# Patient Record
Sex: Male | Born: 1980 | Race: White | Hispanic: No | Marital: Single | Smoking: Current every day smoker
Health system: Southern US, Community
[De-identification: ages and names within clinical notes are randomized; demographics above are authoritative.]

## PROBLEM LIST (undated history)

## (undated) DIAGNOSIS — J45909 Unspecified asthma, uncomplicated: Secondary | ICD-10-CM

## (undated) DIAGNOSIS — N200 Calculus of kidney: Secondary | ICD-10-CM

## (undated) HISTORY — DX: Unspecified asthma, uncomplicated: J45.909

## (undated) HISTORY — DX: Calculus of kidney: N20.0

---

## 2017-06-09 ENCOUNTER — Emergency Department: Payer: Self-pay

## 2017-06-09 ENCOUNTER — Emergency Department
Admission: EM | Admit: 2017-06-09 | Discharge: 2017-06-09 | Disposition: A | Discharge status: Home / Self Care | Payer: Self-pay | Attending: Emergency Medicine | Admitting: Emergency Medicine

## 2017-06-09 DIAGNOSIS — Y999 Unspecified external cause status: Secondary | ICD-10-CM | POA: Insufficient documentation

## 2017-06-09 DIAGNOSIS — F17200 Nicotine dependence, unspecified, uncomplicated: Secondary | ICD-10-CM | POA: Insufficient documentation

## 2017-06-09 DIAGNOSIS — S60512A Abrasion of left hand, initial encounter: Secondary | ICD-10-CM | POA: Insufficient documentation

## 2017-06-09 DIAGNOSIS — S60222A Contusion of left hand, initial encounter: Secondary | ICD-10-CM | POA: Insufficient documentation

## 2017-06-09 DIAGNOSIS — Y9389 Activity, other specified: Secondary | ICD-10-CM | POA: Insufficient documentation

## 2017-06-09 DIAGNOSIS — Y929 Unspecified place or not applicable: Secondary | ICD-10-CM | POA: Insufficient documentation

## 2017-06-09 DIAGNOSIS — W231XXA Caught, crushed, jammed, or pinched between stationary objects, initial encounter: Secondary | ICD-10-CM | POA: Insufficient documentation

## 2017-06-09 MED ORDER — NAPROXEN 500 MG PO TABS: 500.0000 mg | ORAL_TABLET | Freq: Two times a day (BID) | ORAL | 0 refills | Status: AC

## 2017-06-09 MED ORDER — NAPROXEN 500 MG PO TABS
500.0000 mg | ORAL_TABLET | Freq: Once | ORAL | Status: AC
  Administered 2017-06-09: 500 mg via ORAL
  Filled 2017-06-09: qty 1

## 2017-06-09 NOTE — Discharge Instructions (Addendum)
Your x-ray is negative for fracture or dislocation. You have contused the soft tissues of the hand. Keep the wounds clean with soap & water.  Wear the splint for comfort. Apply ice to reduce pain and swelling. Take the prescription medicine as needed. Follow-up with [facility] as needed.

## 2017-06-09 NOTE — ED Provider Notes (Signed)
[facility] Emergency Department Provider Note ____________________________________________  Time seen: 1020  I have reviewed the triage vital signs and the nursing notes.  HISTORY  Chief Complaint  Hand Injury  HPI Ricardo Cummings is a 36 y.o. male for evaluation of left hand pain after he was helping a friend with a lawnmower yesterday.  Describes help and fell at the lumbar onto the back of a truck, when his friend lost his footing.  The lawnmower fell, crushing the patient's hand against the concrete.  He presents today with pain to the radial aspect of the wrist and superficial abrasions in the same area.  History reviewed. No pertinent past medical history.  There are no active problems to display for this patient.  History reviewed. No pertinent surgical history.  Prior to Admission medications   Medication Sig Start Date End Date Taking? Authorizing Provider  naproxen (NAPROSYN) 500 MG tablet Take 1 tablet (500 mg total) by mouth 2 (two) times daily with a meal. 06/09/17 07/09/17  Emalynn Clewis, Charlesetta Ivory, PA-C    Allergies Patient has no known allergies.  No family history on file.  Social History Social History  Substance Use Topics  . Smoking status: Current Every Day Smoker  . Smokeless tobacco: Not on file  . Alcohol use No    Review of Systems  Constitutional: Negative for fever. Cardiovascular: Negative for chest pain. Respiratory: Negative for shortness of breath. Musculoskeletal: Negative for back pain. Left hand pain as above.  Skin: Negative for rash. Neurological: Negative for headaches, focal weakness or numbness. ____________________________________________  PHYSICAL EXAM:  VITAL SIGNS: ED Triage Vitals  Enc Vitals Group     BP 06/09/17 0934 (!) 146/61     Pulse Rate 06/09/17 0934 67     Resp 06/09/17 0934 18     Temp 06/09/17 0934 97.8 F (36.6 C)     Temp Source 06/09/17 0934 Oral     SpO2 06/09/17 0934 100 %   Weight 06/09/17 0934 160 lb (72.6 kg)     Height 06/09/17 0934 5\' 11"  (1.803 m)     Head Circumference --      Peak Flow --      Pain Score 06/09/17 0933 7     Pain Loc --      Pain Edu? --      Excl. in [place]? --     Constitutional: Alert and oriented. Well appearing and in no distress. Head: Normocephalic and atraumatic. Cardiovascular: Normal distal pulses and cap refill Respiratory: Normal respiratory effort.  Musculoskeletal: Left hand without any obvious deformity, dislocation, ecchymosis, or effusion.  Patient with normal composite fist on the left.  Mildly tender to palpation to the base of the left thumb.  Normal pronation and supination range.  Normal resisted flexion and extension on exam.  Nontender with normal range of motion in all extremities.  Neurologic: Cranial nerves II through XII grossly intact.  Normal intrinsic and opposition testing.  Normal gross sensation. Normal speech and language. No gross focal neurologic deficits are appreciated. Skin:  Skin is warm, dry and intact. No rash noted.  Facial abrasions over the hand without any laceration, ulceration, or ecchymosis. ____________________________________________   RADIOLOGY  Left Hand  IMPRESSION: No acute osseous injury of the left hand.  I, Doyce Stonehouse, Charlesetta Ivory, personally viewed and evaluated these images (plain radiographs) as part of my medical decision making, as well as reviewing the written report by the radiologist. ____________________________________________  PROCEDURES  Ace bandage  ____________________________________________  INITIAL IMPRESSION / ASSESSMENT AND PLAN / ED COURSE  Patient with ED evaluation of a crush/contusion to the left hand.  No x-ray indication of any acute fracture or dislocation.  Patient is with some mild radiodorsal hand pain.  No acute arthropathy or tendinopathy suspected.  He is discharged with an Ace bandage in place for comfort.  He will follow-up with 1 of the  local community clinics for ongoing symptom management.  Work note is provided for today as requested. ____________________________________________  FINAL CLINICAL IMPRESSION(S) / ED DIAGNOSES  Final diagnoses:  Contusion of left hand, initial encounter  Abrasion of left hand, initial encounter      Lissa HoardMenshew, Makinsley Schiavi V Bacon, PA-C 06/09/17 1917    Myrna BlazerSchaevitz, David Matthew, MD 06/10/17 1438

## 2017-06-09 NOTE — ED Triage Notes (Signed)
Left hand and wrist pain after lawnmower "fell" on it yesterday. Pt alert and oriented X4, active, cooperative, pt in NAD. RR even and unlabored, color WNL.

## 2019-10-21 ENCOUNTER — Emergency Department: Payer: Self-pay

## 2019-10-21 ENCOUNTER — Other Ambulatory Visit: Payer: Self-pay

## 2019-10-21 ENCOUNTER — Emergency Department
Admission: EM | Admit: 2019-10-21 | Discharge: 2019-10-21 | Disposition: A | Discharge status: Home / Self Care | Payer: Self-pay | Attending: Emergency Medicine | Admitting: Emergency Medicine

## 2019-10-21 DIAGNOSIS — Y939 Activity, unspecified: Secondary | ICD-10-CM | POA: Insufficient documentation

## 2019-10-21 DIAGNOSIS — M7022 Olecranon bursitis, left elbow: Secondary | ICD-10-CM | POA: Insufficient documentation

## 2019-10-21 DIAGNOSIS — F17290 Nicotine dependence, other tobacco product, uncomplicated: Secondary | ICD-10-CM | POA: Insufficient documentation

## 2019-10-21 MED ORDER — LIDOCAINE HCL (PF) 1 % IJ SOLN
5.0000 mL | Freq: Once | INTRAMUSCULAR | Status: AC
  Administered 2019-10-21: 5 mL
  Filled 2019-10-21: qty 5

## 2019-10-21 MED ORDER — HYDROCODONE-ACETAMINOPHEN 5-325 MG PO TABS: 1.0000 | ORAL_TABLET | Freq: Four times a day (QID) | ORAL | 0 refills | Status: AC | PRN

## 2019-10-21 MED ORDER — PREDNISONE 10 MG PO TABS: ORAL_TABLET | ORAL | 0 refills | Status: AC

## 2019-10-21 NOTE — ED Triage Notes (Signed)
Pt in from home with steady gait. Pt reports sudden swelling to L elbow/arm since waking up this morning. Reports it feels hot and is extremely painful and taut. Denies specific mechanism of injury. Reports history of "bursitis" in his knees. Elbow obviously swollen, skin appropriate color, warm but not hot to touch, painful to pt upon palpation. L radial pulse 2+.

## 2019-10-21 NOTE — ED Notes (Signed)
Lidocaine to provider Levada Schilling.

## 2019-10-21 NOTE — Discharge Instructions (Signed)
Follow-up with your primary care provider or Dr. Deeann Saint who is on-call for orthopedics if you continue to have problems with your elbow.  Begin taking the prednisone as directed and also take Vicodin as needed for pain.  Do not drive or operate machinery while taking the Vicodin as it could cause drowsiness and increase your risk for injury.  You may use ice to your elbow as needed for discomfort.

## 2019-10-21 NOTE — ED Provider Notes (Signed)
[facility] Emergency Department Provider Note  ____________________________________________   First MD Initiated Contact with Patient 10/21/19 1156     (approximate)  I have reviewed the triage vital signs and the nursing notes.   HISTORY  Chief Complaint No chief complaint on file.   HPI Ricardo Cummings is a 39 y.o. male presents to the ED with complaint of swelling to his left elbow since waking up this morning.  Patient states it felt hot and extremely painful to touch.  Patient denies any injury.  He states he has a history of bursitis in his knees.  He denies any fever, chills, nausea or vomiting.  He rates his pain as a 10/10      History reviewed. No pertinent past medical history.  There are no problems to display for this patient.   History reviewed. No pertinent surgical history.  Prior to Admission medications   Medication Sig Start Date End Date Taking? Authorizing Provider  HYDROcodone-acetaminophen (NORCO/VICODIN) 5-325 MG tablet Take 1 tablet by mouth every 6 (six) hours as needed for moderate pain. 10/21/19   Johnn Hai, PA-C  predniSONE (DELTASONE) 10 MG tablet Take 6 tablets  today, on day 2 take 5 tablets, day 3 take 4 tablets, day 4 take 3 tablets, day 5 take  2 tablets and 1 tablet the last day 10/21/19   Johnn Hai, PA-C    Allergies Patient has no known allergies.  History reviewed. No pertinent family history.  Social History Social History   Tobacco Use  . Smoking status: Current Every Day Smoker  . Smokeless tobacco: Current User  Substance Use Topics  . Alcohol use: No  . Drug use: Not on file    Review of Systems  Constitutional: No fever/chills Cardiovascular: Denies chest pain. Respiratory: Denies shortness of breath. Gastrointestinal: No nausea, no vomiting. Musculoskeletal: Left elbow pain. Skin: Negative for rash. Neurological: Negative for headaches, focal weakness or numbness.  ___________________________________________   PHYSICAL EXAM:  VITAL SIGNS: ED Triage Vitals [10/21/19 1202]  Enc Vitals Group     BP (!) 152/98     Pulse Rate 93     Resp 18     Temp 98.3 F (36.8 C)     Temp Source Oral     SpO2 98 %     Weight      Height      Head Circumference      Peak Flow      Pain Score      Pain Loc      Pain Edu?      Excl. in [place]?     Constitutional: Alert and oriented. Well appearing and in no acute distress. Eyes: Conjunctivae are normal.  Head: Atraumatic. Neck: No stridor.   Cardiovascular: Normal rate, regular rhythm. Grossly normal heart sounds.  Good peripheral circulation. Respiratory: Normal respiratory effort.  No retractions. Lungs CTAB. Musculoskeletal: Examination of the left elbow there is moderate localized edema posterior olecranon area with moderate tenderness to palpation.  No warmth or redness is noted.  No obvious injury and skin is intact.  Patient's range of motion is decreased secondary to pain and swelling.  Exam is consistent with an olecranon bursitis. Neurologic:  Normal speech and language. No gross focal neurologic deficits are appreciated. No gait instability. Skin:  Skin is warm, dry and intact. No rash noted. Psychiatric: Mood and affect are normal. Speech and behavior are normal.  ____________________________________________   LABS (all labs ordered are  listed, but only abnormal results are displayed)  Labs Reviewed - No data to display  RADIOLOGY  ED MD interpretation:   Left elbow without acute bony injury.  Official radiology report(s): DG Elbow Complete Left  Result Date: 10/21/2019 CLINICAL DATA:  Left elbow pain and swelling since this morning. No known injury. EXAM: LEFT ELBOW - COMPLETE 3+ VIEW COMPARISON:  None. FINDINGS: Pronounced posterior soft tissue swelling. Minimal olecranon spur. Otherwise, normal appearing bones and soft tissues with no effusion seen. IMPRESSION: Posterior soft tissue  swelling, compatible with olecranon bursitis. Electronically Signed   By: Beckie Salts M.D.   On: 10/21/2019 12:48    ____________________________________________   PROCEDURES  Procedure(s) performed (including Critical Care):  Aspiration of blood/fluid  Date/Time: 10/21/2019 1:10 PM Performed by: Tommi Rumps, PA-C Authorized by: Tommi Rumps, PA-C  Consent: Verbal consent obtained. Written consent not obtained. Consent given by: patient Patient understanding: patient states understanding of the procedure being performed Imaging studies: imaging studies available Preparation: Patient was prepped and draped in the usual sterile fashion. Local anesthesia used: yes Anesthesia: local infiltration  Anesthesia: Local anesthesia used: yes Local Anesthetic: lidocaine 1% without epinephrine Anesthetic total: 3 mL  Sedation: Patient sedated: no  Patient tolerance: patient tolerated the procedure well with no immediate complications Comments: 3 cc of serosanguineous fluid was removed from the olecranon bursa.  Patient tolerated procedure well.   ____________________________________________   INITIAL IMPRESSION / ASSESSMENT AND PLAN / ED COURSE  As part of my medical decision making, I reviewed the following data within the electronic MEDICAL RECORD NUMBER Notes from prior ED visits and [place] Controlled Substance Database  39 year old male presents to the ED with complaint of waking up this morning with swelling to his left elbow without history of injury.  Patient states he has a history of bursitis in his knees.  He has not taken any over-the-counter medication.  He denies any other medical problems.  Aspiration of the bursa was successful with 3 cc of serosanguineous fluid withdrawn.  Patient had a pressure dressing placed.  He is to begin taking prednisone 60 mg 6-day taper and Norco if needed for pain.  He is to follow-up with Dr. Deeann Saint who is on-call for orthopedics if he  continues to have problems with his elbow.  ____________________________________________   FINAL CLINICAL IMPRESSION(S) / ED DIAGNOSES  Final diagnoses:  Olecranon bursitis of left elbow     ED Discharge Orders         Ordered    predniSONE (DELTASONE) 10 MG tablet     10/21/19 1329    HYDROcodone-acetaminophen (NORCO/VICODIN) 5-325 MG tablet  Every 6 hours PRN     10/21/19 1329           Note:  This document was prepared using Dragon voice recognition software and may include unintentional dictation errors.    Tommi Rumps, PA-C 10/21/19 1352    Dionne Bucy, MD 10/21/19 1620

## 2021-02-26 IMAGING — DX DG ELBOW COMPLETE 3+V*L*
4 series · 4 of 4 positions shown · non-contrast
Comparison: None.

CLINICAL DATA: Left elbow pain and swelling since this morning. No
known injury.

EXAM:
LEFT ELBOW - COMPLETE 3+ VIEW

[elbow ap]
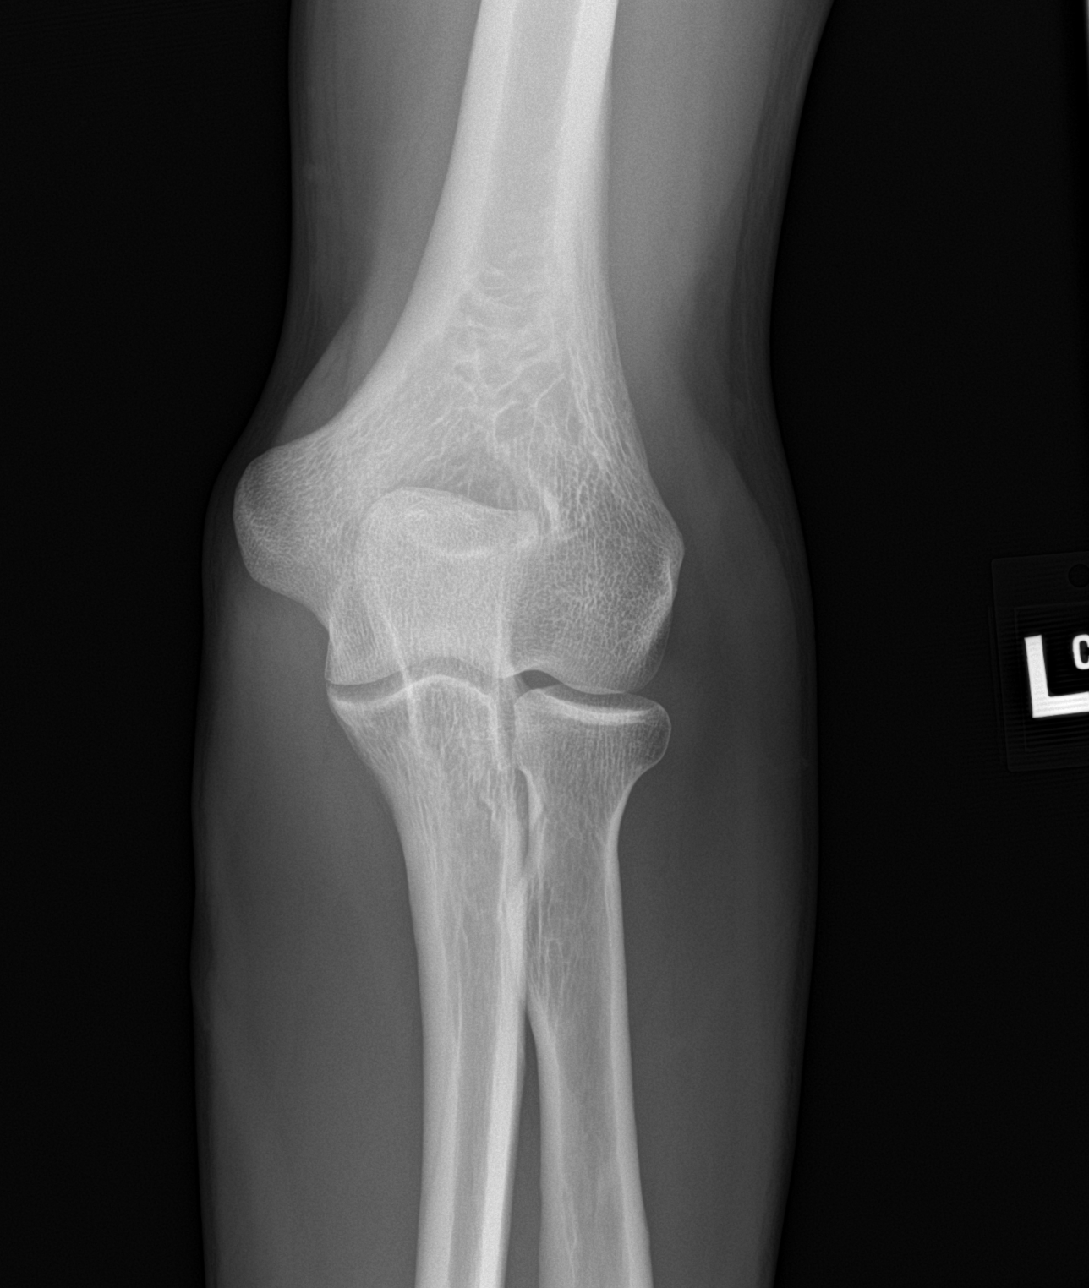

[elbow obl (1 of 2)]
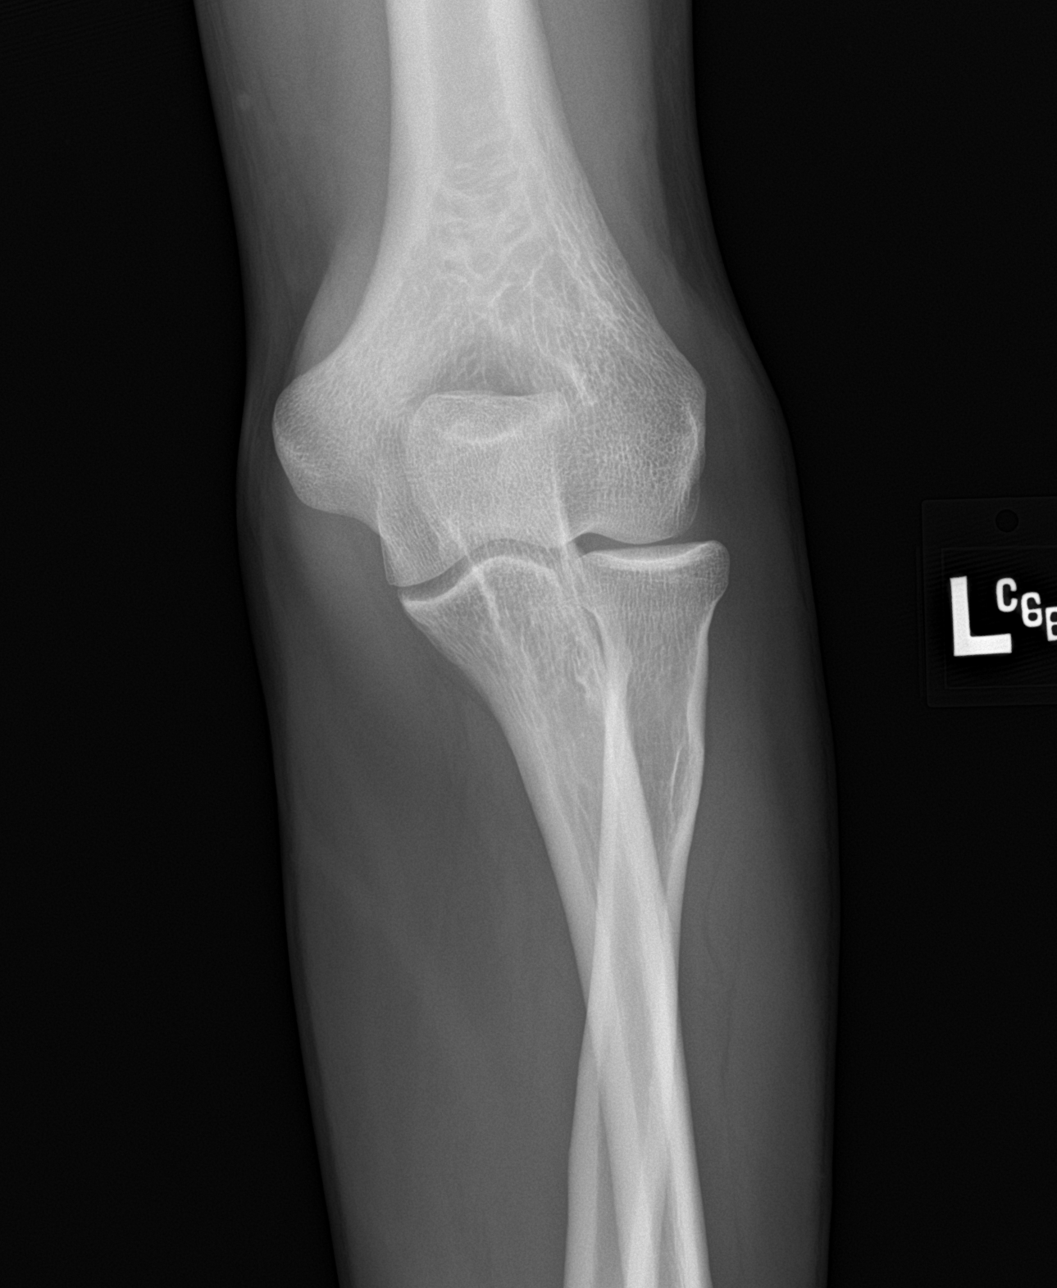

[elbow obl (2 of 2)]
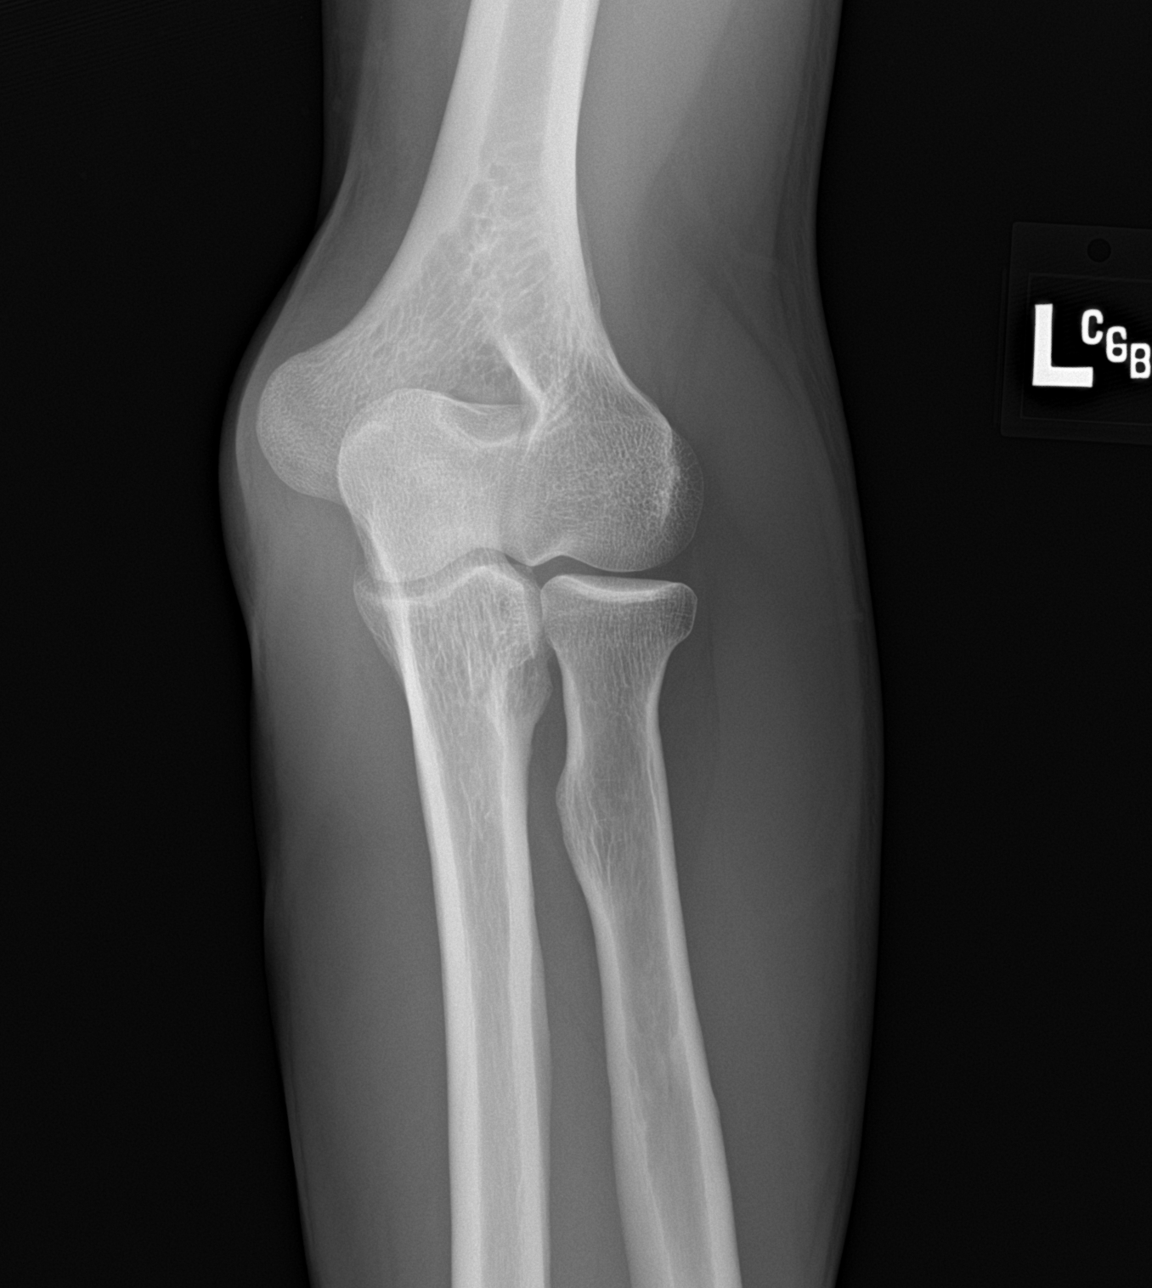

[elbow lat]
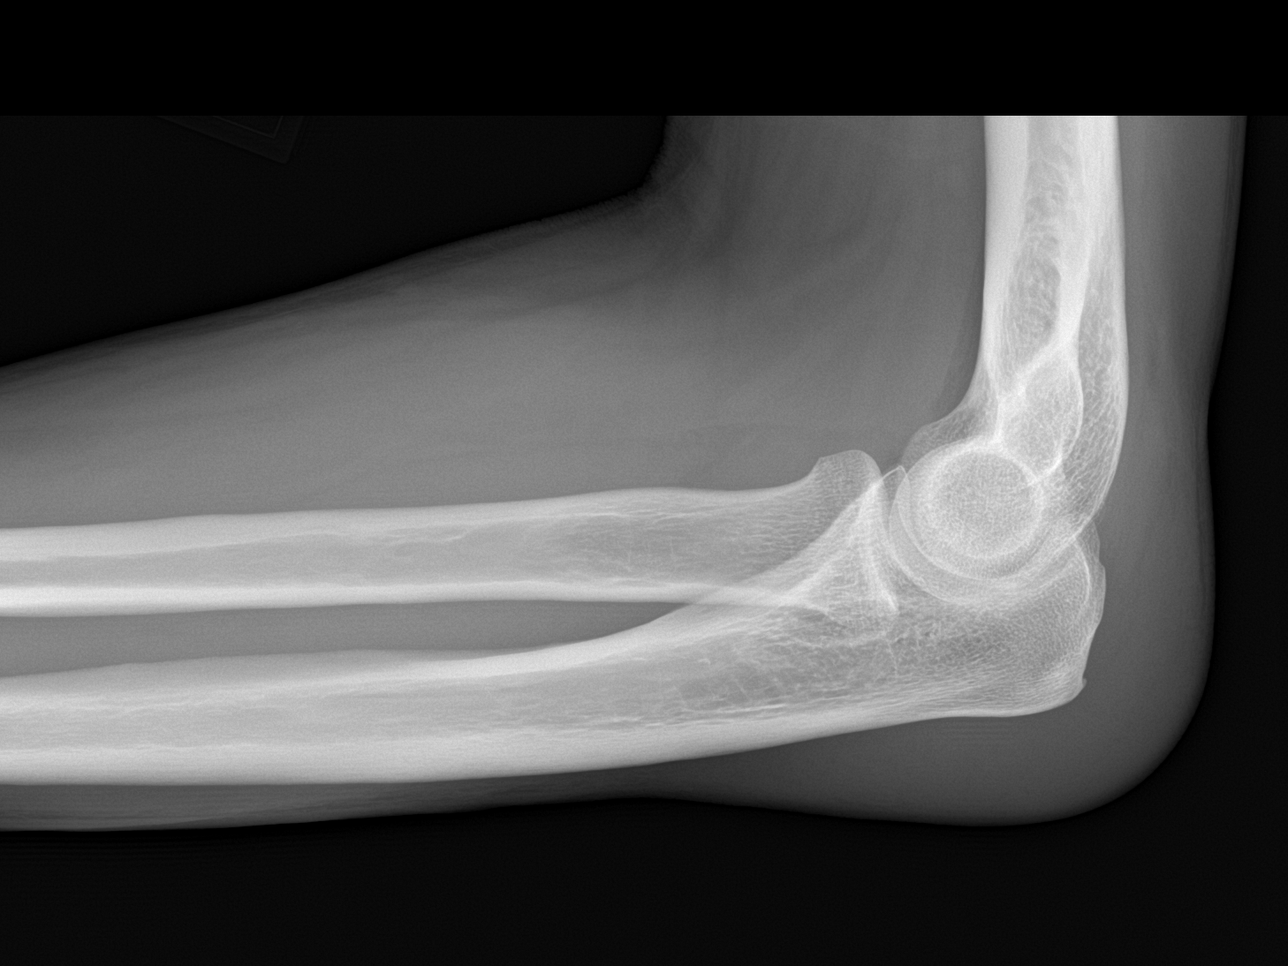

[4 of 4 positions shown; findings below may reference images not displayed]

FINDINGS: Pronounced posterior soft tissue swelling. Minimal olecranon spur.
Otherwise, normal appearing bones and soft tissues with no effusion
seen.
IMPRESSION: Posterior soft tissue swelling, compatible with olecranon bursitis.

## 2023-02-06 ENCOUNTER — Other Ambulatory Visit: Payer: Self-pay

## 2023-02-06 ENCOUNTER — Emergency Department
Admission: EM | Admit: 2023-02-06 | Discharge: 2023-02-06 | Disposition: A | Discharge status: Home / Self Care | Payer: Self-pay | Attending: Emergency Medicine | Admitting: Emergency Medicine

## 2023-02-06 ENCOUNTER — Encounter: Payer: Self-pay | Admitting: Radiology

## 2023-02-06 DIAGNOSIS — M71162 Other infective bursitis, left knee: Secondary | ICD-10-CM

## 2023-02-06 DIAGNOSIS — M7042 Prepatellar bursitis, left knee: Secondary | ICD-10-CM | POA: Insufficient documentation

## 2023-02-06 DIAGNOSIS — Y939 Activity, unspecified: Secondary | ICD-10-CM | POA: Insufficient documentation

## 2023-02-06 DIAGNOSIS — J45909 Unspecified asthma, uncomplicated: Secondary | ICD-10-CM | POA: Insufficient documentation

## 2023-02-06 LAB — COMPREHENSIVE METABOLIC PANEL
ALT: 13 U/L (ref 0–44)
AST: 23 U/L (ref 15–41)
Albumin: 3.8 g/dL (ref 3.5–5.0)
Alkaline Phosphatase: 75 U/L (ref 38–126)
Anion gap: 9 (ref 5–15)
BUN: 12 mg/dL (ref 6–20)
CO2: 27 mmol/L (ref 22–32)
Calcium: 9.2 mg/dL (ref 8.9–10.3)
Chloride: 102 mmol/L (ref 98–111)
Creatinine, Ser: 0.88 mg/dL (ref 0.61–1.24)
GFR, Estimated: >60 mL/min (ref >60)
Glucose, Bld: 132 mg/dL — ABNORMAL HIGH (ref 70–99)
Potassium: 3.4 mmol/L — ABNORMAL LOW (ref 3.5–5.1)
Sodium: 138 mmol/L (ref 135–145)
Total Bilirubin: 0.9 mg/dL (ref 0.3–1.2)
Total Protein: 7.4 g/dL (ref 6.5–8.1)

## 2023-02-06 LAB — SYNOVIAL CELL COUNT + DIFF, W/ CRYSTALS
Crystals, Fluid: NONE SEEN
Eosinophils-Synovial: 0 %
Lymphocytes-Synovial Fld: 4 %
Monocyte-Macrophage-Synovial Fluid: 1 %
Neutrophil, Synovial: 95 %
WBC, Synovial: 27281 /mm3 — ABNORMAL HIGH (ref 0–200)

## 2023-02-06 LAB — BODY FLUID CULTURE W GRAM STAIN

## 2023-02-06 LAB — CBC
HCT: 41.4 % (ref 39.0–52.0)
Hemoglobin: 14.1 g/dL (ref 13.0–17.0)
MCH: 30.8 pg (ref 26.0–34.0)
MCHC: 34.1 g/dL (ref 30.0–36.0)
MCV: 90.4 fL (ref 80.0–100.0)
Platelets: 226 10*3/uL (ref 150–400)
RBC: 4.58 MIL/uL (ref 4.22–5.81)
RDW: 12.3 % (ref 11.5–15.5)
WBC: 9.5 10*3/uL (ref 4.0–10.5)
nRBC: 0 % (ref 0.0–0.2)

## 2023-02-06 MED ORDER — VANCOMYCIN HCL 1500 MG/300ML IV SOLN
1500.0000 mg | Freq: Once | INTRAVENOUS | Status: AC
  Administered 2023-02-06: 1500 mg via INTRAVENOUS
  Filled 2023-02-06: qty 300

## 2023-02-06 MED ORDER — SODIUM CHLORIDE 0.9 % IV SOLN
2.0000 g | Freq: Once | INTRAVENOUS | Status: AC
  Administered 2023-02-06: 2 g via INTRAVENOUS
  Filled 2023-02-06: qty 20

## 2023-02-06 MED ORDER — DOXYCYCLINE MONOHYDRATE 100 MG PO TABS: 100.0000 mg | ORAL_TABLET | Freq: Two times a day (BID) | ORAL | 0 refills | Status: AC

## 2023-02-06 MED ORDER — AMOXICILLIN 875 MG PO TABS: 875.0000 mg | ORAL_TABLET | Freq: Two times a day (BID) | ORAL | 0 refills | Status: AC

## 2023-02-06 MED ORDER — IBUPROFEN 400 MG PO TABS
400.0000 mg | ORAL_TABLET | Freq: Once | ORAL | Status: AC
  Administered 2023-02-06: 400 mg via ORAL
  Filled 2023-02-06: qty 1

## 2023-02-06 NOTE — ED Triage Notes (Signed)
PT states for the last three days his left knee has been swollen. Last night he applied ice without relief. Pt knee is swollen, red and warm to touch. PT states he is unable to put weight on the knee.

## 2023-02-06 NOTE — Discharge Instructions (Addendum)
You have an infection of your prepatellar bursa.  It is very important that you take the 2 antibiotics for the next 10 days.  You can use moist heat on the area and elevate the leg.  Follow-up with the orthopedist clinic in about 2 to 3 days on Monday or Tuesday.  If the pain is worsening or you develop fevers or the redness is spreading and please return to the emergency department.

## 2023-02-06 NOTE — ED Provider Notes (Addendum)
[facility] Provider Note    Event Date/Time   First MD Initiated Contact with Patient 02/06/23 1403     (approximate)   History   Cellulitis   HPI  Ricardo Cummings is a 42 y.o. male past medical history of asthma and kidney stones who presents with swelling to the left knee.  Patient noticed it about 5 days ago.  Initially started with just pain and then over the last day or so its become swollen and red.  Having difficulty walking.  No fevers or chills or other systemic symptoms.  Denies IV drug use.  Denies significant medical problems.  Patient tells me has had to have the knee drained before but was told it was not an infection.  He is not sure what the cause was.  Denies tick bites.     Past Medical History:  Diagnosis Date   Asthma    Kidney stones     There are no problems to display for this patient.    Physical Exam  Triage Vital Signs: ED Triage Vitals  Enc Vitals Group     BP 02/06/23 1319 124/70     Pulse Rate 02/06/23 1319 99     Resp 02/06/23 1319 18     Temp 02/06/23 1319 98.3 F (36.8 C)     Temp Source 02/06/23 1319 Oral     SpO2 02/06/23 1319 97 %     Weight 02/06/23 1320 150 lb (68 kg)     Height 02/06/23 1320 5\' 11"  (1.803 m)     Head Circumference --      Peak Flow --      Pain Score --      Pain Loc --      Pain Edu? --      Excl. in [place]? --     Most recent vital signs: Vitals:   02/06/23 1319  BP: 124/70  Pulse: 99  Resp: 18  Temp: 98.3 F (36.8 C)  SpO2: 97%     General: Awake, no distress.  CV:  Good peripheral perfusion.  Resp:  Normal effort.  Abd:  No distention.  Neuro:             Awake, Alert, Oriented x 3  Other:  There is erythema over the left knee most pronounced over the prepatellar region and on the right medial surface of the knee starting to track down the leg, it is tender to palpation and warm but patient is able to range the knee with some discomfort but no pain with micro movements no  open wound   ED Results / Procedures / Treatments  Labs (all labs ordered are listed, but only abnormal results are displayed) Labs Reviewed  COMPREHENSIVE METABOLIC PANEL - Abnormal; Notable for the following components:      Result Value   Potassium 3.4 (*)    Glucose, Bld 132 (*)    All other components within normal limits  SYNOVIAL CELL COUNT + DIFF, W/ CRYSTALS - Abnormal; Notable for the following components:   Appearance-Synovial CLOUDY (*)    WBC, Synovial 27,281 (*)    All other components within normal limits  BODY FLUID CULTURE W GRAM STAIN  CBC     EKG     RADIOLOGY I performed a bedside ultrasound of the knee joint which does not show any knee effusion however there is a prepatellar fluid collection that looks complex and partially loculated   PROCEDURES:  Critical Care performed:  No  .Joint Aspiration/Arthrocentesis  Date/Time: 02/06/2023 2:55 PM  Performed by: Georga Hacking, MD Authorized by: Georga Hacking, MD   Consent:    Consent obtained:  Verbal   Risks discussed:  Bleeding, infection and pain Universal protocol:    Patient identity confirmed:  Verbally with patient Location:    Location:  Knee Anesthesia:    Anesthesia method:  Local infiltration   Local anesthetic:  Lidocaine 1% w/o epi Procedure details:    Needle gauge:  18 G   Ultrasound guidance: no     Approach: prepatellar bursa aspiration.   Aspirate amount:  5   Aspirate characteristics:  Serous   Steroid injected: no     Specimen collected: yes   Post-procedure details:    Dressing:  Sterile dressing   Procedure completion:  Tolerated   The patient is on the cardiac monitor to evaluate for evidence of arrhythmia and/or significant heart rate changes.   MEDICATIONS ORDERED IN ED: Medications  cefTRIAXone (ROCEPHIN) 2 g in sodium chloride 0.9 % 100 mL IVPB (has no administration in time range)  vancomycin (VANCOREADY) IVPB 1500 mg/300 mL (has no administration  in time range)  ibuprofen (ADVIL) tablet 400 mg (400 mg Oral Given 02/06/23 1457)     IMPRESSION / MDM / ASSESSMENT AND PLAN / ED COURSE  I reviewed the triage vital signs and the nursing notes.                              Patient's presentation is most consistent with acute complicated illness / injury requiring diagnostic workup.  Differential diagnosis includes, but is not limited to, septic joint, septic bursitis, inflammatory arthritis, inflammatory bursitis, cellulitis  The patient is a 42 year old male who presents with knee redness pain and swelling.  Pain started several days ago and redness started yesterday he is now having difficulty ambulating.  He does not have any systemic infectious symptoms including no fevers.  Does not use any IV drugs.  Has history of asthma no other medical comorbidities.  His vital signs are stable he is afebrile here.  On exam he does have erythema around the knee most pronounced in the prepatellar region and is starting to track down his leg.  It is somewhat swollen as well.  The prepatellar region does feel somewhat tense.  He has pain with range of motion of the knee but is able to range and has no pain with micro movements.  I performed a bedside ultrasound which does not show an obvious knee effusion however there is a prepatellar collection which looks somewhat complex.  I did perform a aspiration of the prepatellar bursa with return of serous fluid.  Will send for cell count crystals and Gram stain and culture.  Patient's labs otherwise looking good with no leukocytosis.  Patient's bursal fluid has 27,000 white cells with majority neutrophils no crystals.  Gram stain is still in process.  Patient prefers to go home, I felt that admission with IV antibiotics is probably the best option but given patient's preference, I did discuss the case with on-call orthopedist Dr. Hyacinth Meeker we thought it was reasonable for him to go home after dose of IV antibiotics  and then close orthopedic follow-up.  Recommended prescription of Doxy.  Per up-to-date guidelines we will do Doxy and amoxicillin 10 days.  Patient was given a dose of ceftriaxone and vancomycin in the ED. Will have him follow-up with orthopedics  either on Monday or Tuesday.  Recommended warm compresses per Dr. Rondel Baton recommendations as well as elevation.  Stressed the importance of close orthopedic follow-up.       FINAL CLINICAL IMPRESSION(S) / ED DIAGNOSES   Final diagnoses:  Septic prepatellar bursitis of left knee     Rx / DC Orders   ED Discharge Orders          Ordered    doxycycline (ADOXA) 100 MG tablet  2 times daily        02/06/23 1714    amoxicillin (AMOXIL) 875 MG tablet  2 times daily        02/06/23 1714             Note:  This document was prepared using Dragon voice recognition software and may include unintentional dictation errors.   Georga Hacking, MD 02/06/23 1714    Georga Hacking, MD 02/06/23 [phone]

## 2023-02-06 NOTE — ED Triage Notes (Signed)
Pt with red going from his knee down his leg. This nurse outlined the red.

## 2023-02-07 LAB — BODY FLUID CULTURE W GRAM STAIN

## 2023-02-08 LAB — BODY FLUID CULTURE W GRAM STAIN
